# Patient Record
Sex: Male | Born: 2000 | Race: Black or African American | Hispanic: No | Marital: Single
Health system: Southern US, Community
[De-identification: ages and names within clinical notes are randomized; demographics above are authoritative.]

## PROBLEM LIST (undated history)

## (undated) DIAGNOSIS — J45909 Unspecified asthma, uncomplicated: Secondary | ICD-10-CM

## (undated) DIAGNOSIS — Z9109 Other allergy status, other than to drugs and biological substances: Secondary | ICD-10-CM

## (undated) HISTORY — PX: SUPERFICIAL LYMPH NODE BIOPSY / EXCISION: SUR127

---
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
## 2021-10-01 IMAGING — DX DG NASAL BONES 3+V
4 series · 4 of 4 positions shown · non-contrast
Comparison: None.

CLINICAL DATA: Pain

EXAM:
NASAL BONES - 3+ VIEW

[[person_name] (1 of 2)]
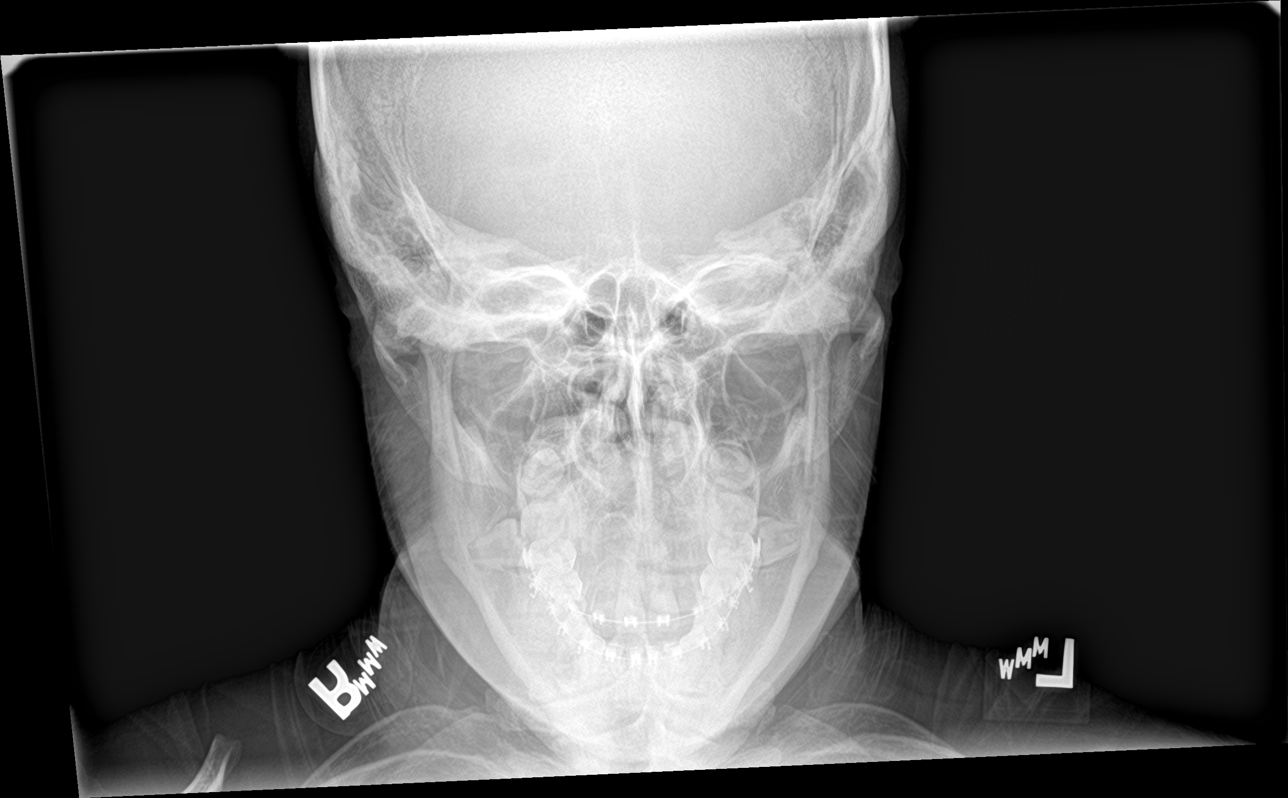

[nasal lat (1 of 2)]
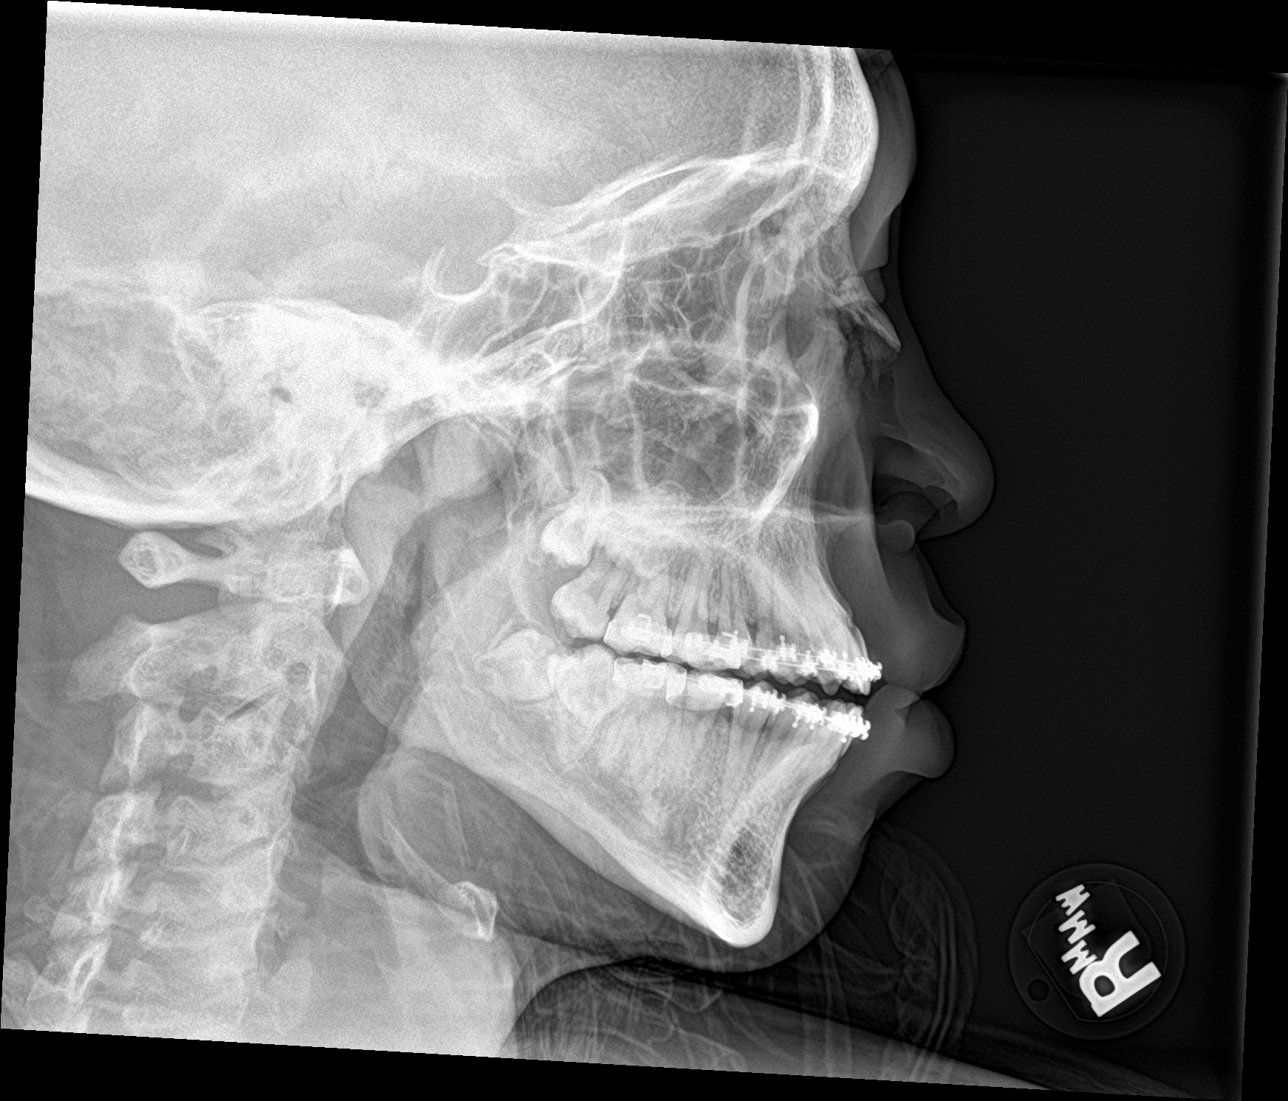

[nasal lat (2 of 2)]
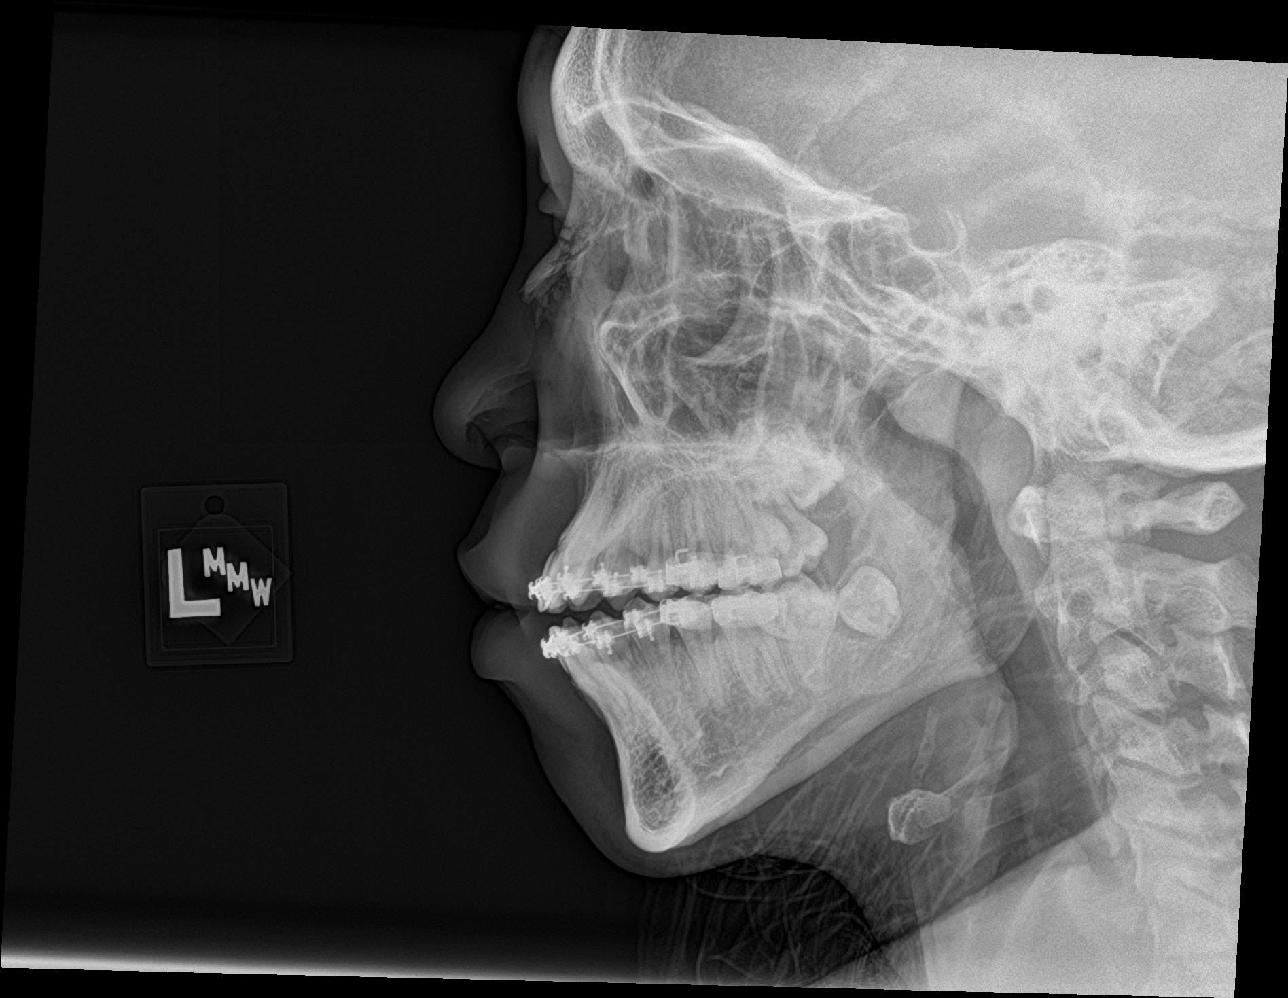

[[person_name] (2 of 2)]
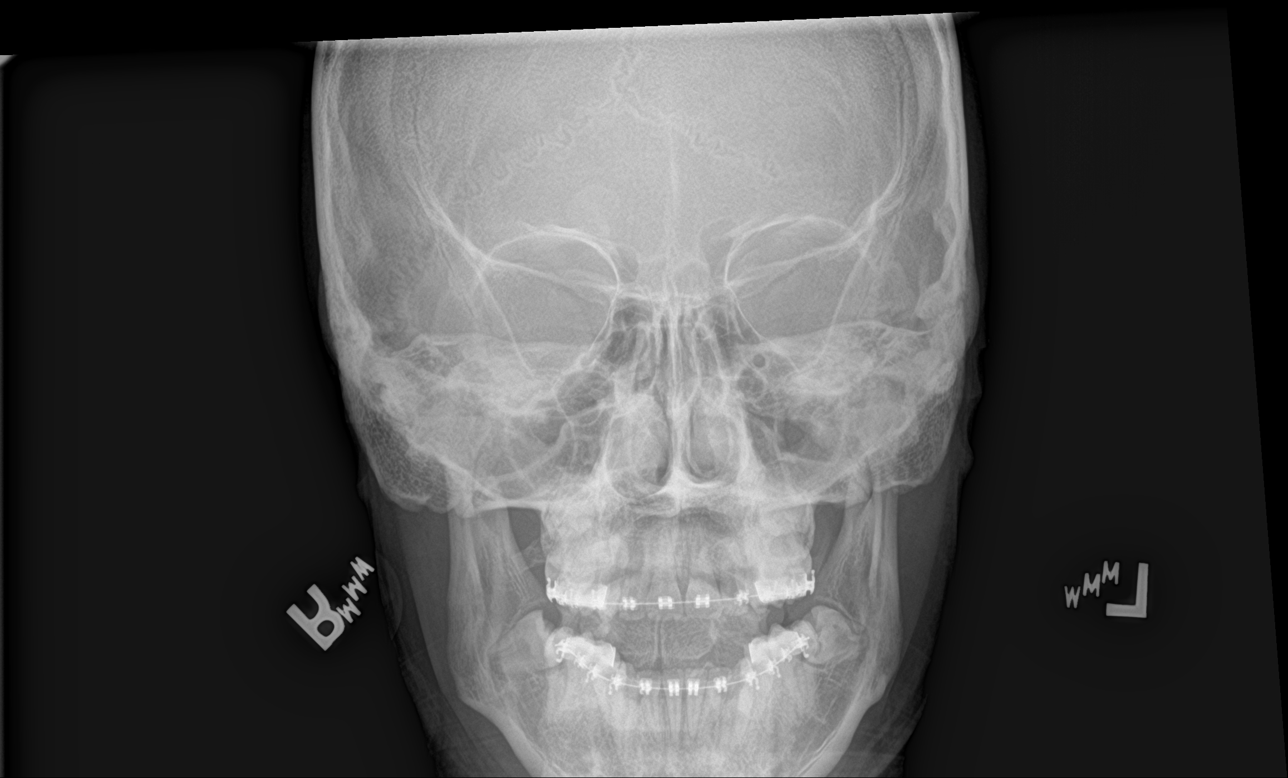

[4 of 4 positions shown; findings below may reference images not displayed]

FINDINGS: There is no evidence of fracture or other bone abnormality.
IMPRESSION: Negative.
